# Patient Record
Sex: Female | Born: 1955
Health system: Southern US, Community
[De-identification: ages and names within clinical notes are randomized; demographics above are authoritative.]

## PROBLEM LIST (undated history)

## (undated) DIAGNOSIS — M1990 Unspecified osteoarthritis, unspecified site: Secondary | ICD-10-CM

## (undated) DIAGNOSIS — S0990XA Unspecified injury of head, initial encounter: Secondary | ICD-10-CM

## (undated) DIAGNOSIS — M719 Bursopathy, unspecified: Secondary | ICD-10-CM

## (undated) HISTORY — PX: OTHER SURGICAL HISTORY: SHX169

## (undated) HISTORY — PX: BRAIN SURGERY: SHX531

## (undated) HISTORY — PX: FRACTURE SURGERY: SHX138

## (undated) HISTORY — PX: APPENDECTOMY: SHX54

---

## 1998-09-20 ENCOUNTER — Ambulatory Visit (HOSPITAL_COMMUNITY): Admission: RE | Admit: 1998-09-20 | Discharge: 1998-09-20 | Payer: Self-pay | Admitting: Obstetrics & Gynecology

## 1998-09-20 ENCOUNTER — Encounter: Payer: Self-pay | Admitting: Obstetrics & Gynecology

## 1999-09-25 ENCOUNTER — Encounter: Payer: Self-pay | Admitting: Obstetrics & Gynecology

## 1999-09-25 ENCOUNTER — Ambulatory Visit (HOSPITAL_COMMUNITY): Admission: RE | Admit: 1999-09-25 | Discharge: 1999-09-25 | Payer: Self-pay | Admitting: Obstetrics & Gynecology

## 2000-09-27 ENCOUNTER — Encounter: Payer: Self-pay | Admitting: Obstetrics & Gynecology

## 2000-09-27 ENCOUNTER — Ambulatory Visit (HOSPITAL_COMMUNITY): Admission: RE | Admit: 2000-09-27 | Discharge: 2000-09-27 | Payer: Self-pay | Admitting: Obstetrics & Gynecology

## 2001-01-26 ENCOUNTER — Encounter: Admission: RE | Admit: 2001-01-26 | Discharge: 2001-01-26 | Payer: Self-pay | Admitting: Family Medicine

## 2001-01-26 ENCOUNTER — Encounter: Payer: Self-pay | Admitting: Family Medicine

## 2003-01-12 ENCOUNTER — Other Ambulatory Visit: Admission: RE | Admit: 2003-01-12 | Discharge: 2003-01-12 | Payer: Self-pay | Admitting: Obstetrics & Gynecology

## 2004-06-09 ENCOUNTER — Other Ambulatory Visit: Admission: RE | Admit: 2004-06-09 | Discharge: 2004-06-09 | Payer: Self-pay | Admitting: Obstetrics & Gynecology

## 2005-09-08 ENCOUNTER — Other Ambulatory Visit: Admission: RE | Admit: 2005-09-08 | Discharge: 2005-09-08 | Payer: Self-pay | Admitting: Obstetrics & Gynecology

## 2009-09-26 ENCOUNTER — Encounter (INDEPENDENT_AMBULATORY_CARE_PROVIDER_SITE_OTHER): Payer: Self-pay | Admitting: *Deleted

## 2010-10-02 NOTE — Letter (Signed)
Summary: New Patient letter  [facility]  [address] [place], [place] [address]   Phone: [phone]  Fax: [phone]       09/26/2009 MRN: [id number]  [facility] [address] [place], [place]  [address]  Dear Taylor Munoz,  Welcome to the Gastroenterology Division at [facility]    You are scheduled to see Dr.  Marina Goodell on 10/14/2009 at 11:15AM on the 3rd floor at [facility], [address]  We ask that you try to arrive at our office 15 minutes prior to your appointment time to allow for check-in.  We would like you to complete the enclosed self-administered evaluation form prior to your visit and bring it with you on the day of your appointment.  We will review it with you.  Also, please bring a complete list of all your medications or, if you prefer, bring the medication bottles and we will list them.  Please bring your insurance card so that we may make a copy of it.  If your insurance requires a referral to see a specialist, please bring your referral form from your primary care physician.  Co-payments are due at the time of your visit and may be paid by cash, check or credit card.     Your office visit will consist of a consult with your physician (includes a physical exam), any laboratory testing he/she may order, scheduling of any necessary diagnostic testing (e.g. x-ray, ultrasound, CT-scan), and scheduling of a procedure (e.g. Endoscopy, Colonoscopy) if required.  Please allow enough time on your schedule to allow for any/all of these possibilities.    If you cannot keep your appointment, please call [phone] to cancel or reschedule prior to your appointment date.  This allows [place] the opportunity to schedule an appointment for another patient in need of care.  If you do not cancel or reschedule by 5 p.m. the business day prior to your appointment date, you will be charged a $50.00 late cancellation/no-show fee.    Thank you for  choosing [facility] for your medical needs.  We appreciate the opportunity to care for you.  Please visit [place] at our website  to learn more about our practice.                     Sincerely,                                                             The Gastroenterology Division

## 2012-02-26 ENCOUNTER — Emergency Department (HOSPITAL_COMMUNITY)
Admission: EM | Admit: 2012-02-26 | Discharge: 2012-02-26 | Disposition: A | Discharge status: Home / Self Care | Payer: BC Managed Care – PPO | Attending: Emergency Medicine | Admitting: Emergency Medicine

## 2012-02-26 ENCOUNTER — Encounter (HOSPITAL_COMMUNITY): Payer: Self-pay | Admitting: Family Medicine

## 2012-02-26 ENCOUNTER — Telehealth (HOSPITAL_COMMUNITY): Payer: Self-pay | Admitting: *Deleted

## 2012-02-26 DIAGNOSIS — Z209 Contact with and (suspected) exposure to unspecified communicable disease: Secondary | ICD-10-CM

## 2012-02-26 DIAGNOSIS — Z79899 Other long term (current) drug therapy: Secondary | ICD-10-CM | POA: Insufficient documentation

## 2012-02-26 DIAGNOSIS — F17200 Nicotine dependence, unspecified, uncomplicated: Secondary | ICD-10-CM | POA: Insufficient documentation

## 2012-02-26 DIAGNOSIS — Z203 Contact with and (suspected) exposure to rabies: Secondary | ICD-10-CM | POA: Insufficient documentation

## 2012-02-26 HISTORY — DX: Unspecified osteoarthritis, unspecified site: M19.90

## 2012-02-26 HISTORY — DX: Unspecified injury of head, initial encounter: S09.90XA

## 2012-02-26 HISTORY — DX: Bursopathy, unspecified: M71.9

## 2012-02-26 MED ORDER — RABIES IMMUNE GLOBULIN 150 UNIT/ML IM INJ
20.0000 [IU]/kg | INJECTION | Freq: Once | INTRAMUSCULAR | Status: AC
  Administered 2012-02-26: 1125 [IU] via INTRAMUSCULAR
  Filled 2012-02-26: qty 8

## 2012-02-26 MED ORDER — RABIES VACCINE, PCEC IM SUSR
1.0000 mL | Freq: Once | INTRAMUSCULAR | Status: AC
  Administered 2012-02-26: 1 mL via INTRAMUSCULAR
  Filled 2012-02-26: qty 1

## 2012-02-26 NOTE — ED Provider Notes (Signed)
Medical screening examination/treatment/procedure(s) were performed by non-physician practitioner and as supervising physician I was immediately available for consultation/collaboration.  Doug Sou, MD 02/26/12 [phone]

## 2012-02-26 NOTE — ED Provider Notes (Signed)
History     CSN: [id number]  Arrival date & time 02/26/12  1117   First MD Initiated Contact with Patient 02/26/12 1138      Chief Complaint  Patient presents with  . Rabies Injection    (Consider location/radiation/quality/duration/timing/severity/associated sxs/prior treatment) HPI Comments: Patient comes after possible exposure to bats.  She reports that he woke up on both Saturday morning and 2023-02-07 morning and found a dead bat in the house.  She does not feel that he was bitten by the bat, but was sleeping in the same area as the bat.  Her tetanus is UTD.  She denies any noticeable wound.  She is currently asymptomatic.   The history is provided by the patient.    Past Medical History  Diagnosis Date  . Bursitis   . Arthritis   . Head injury     Past Surgical History  Procedure Date  . Fracture surgery   . Appendectomy   . Brain surgery   . Tracheotomy     History reviewed. No pertinent family history.  History  Substance Use Topics  . Smoking status: Current Some Day Smoker  . Smokeless tobacco: Not on file  . Alcohol Use: 0.6 oz/week    1 Glasses of wine per week    OB History    Grav Para Term Preterm Abortions TAB SAB Ect Mult Living                  Review of Systems  Constitutional: Negative for fever, chills and fatigue.  HENT: Negative for sore throat, neck pain and neck stiffness.   Eyes: Negative for photophobia and visual disturbance.  Respiratory: Negative for shortness of breath.   Gastrointestinal: Negative for nausea and vomiting.  Musculoskeletal: Negative for gait problem.  Skin: Negative for color change and wound.  Neurological: Negative for syncope, light-headedness and headaches.  Psychiatric/Behavioral: Negative for confusion.    Allergies  Review of patient's allergies indicates no known allergies.  Home Medications   Current Outpatient Rx  Name Route Sig Dispense Refill  . CALCIUM CARBONATE-VITAMIN D 500-200 MG-UNIT PO  TABS Oral Take 1 tablet by mouth daily.    . ETODOLAC 400 MG PO TABS Oral Take 400 mg by mouth 2 (two) times daily.    Marland Kitchen MESALAMINE ER 0.375 G PO CP24 Oral Take 1,500 mg by mouth every morning.    . ADULT MULTIVITAMIN W/MINERALS CH Oral Take 1 tablet by mouth daily.    . OMEGA-3-ACID ETHYL ESTERS 1 G PO CAPS Oral Take 1 g by mouth 2 (two) times daily.      BP 103/62  Pulse 62  Temp 98.4 F (36.9 C) (Oral)  Resp 14  Wt 120 lb 5 oz (54.573 kg)  SpO2 98%  Physical Exam  Nursing note and vitals reviewed. Constitutional: She is oriented to person, place, and time. She appears well-developed and well-nourished. No distress.  HENT:  Head: Normocephalic and atraumatic.  Mouth/Throat: Oropharynx is clear and moist.  Eyes: EOM are normal. Pupils are equal, round, and reactive to light.  Neck: Normal range of motion. Neck supple.  Cardiovascular: Normal rate, regular rhythm and normal heart sounds.   Pulmonary/Chest: Effort normal and breath sounds normal. No respiratory distress.  Abdominal: Soft. There is no tenderness.  Musculoskeletal: Normal range of motion.  Neurological: She is alert and oriented to person, place, and time. She has normal strength. No sensory deficit. Gait normal.  Skin: Skin is warm, dry and intact. She  is not diaphoretic. No erythema.       No wound or bite visualized  Psychiatric: She has a normal mood and affect.    ED Course  Procedures (including critical care time)  Labs Reviewed - No data to display No results found.   1. Exposure to bat without known bite       MDM  Patient presents after finding a dead bat in her house.  She is concerned about rabies.  Since the patient has been sleeping in the same area where the bat was found, patient was started on Rabies prophylaxis.  No obvious bite.  Patient is currently asymptomatic.  Patient given the schedule for follow up Rabies vaccines.        Pascal Lux [place], PA-C 02/26/12 1402

## 2012-02-26 NOTE — ED Notes (Signed)
Rabies schedule received. I called pt. and asked when she could come on Mon. She is not at home and does not know her schedule. She will call me on Mon. @ 1130 to set the times. Vassie Moselle 02/26/2012

## 2012-02-26 NOTE — Discharge Instructions (Signed)
Follow up for additional rabies vaccine in 3 days, 7 days, and 14 days.

## 2012-02-26 NOTE — ED Notes (Signed)
Rabies shot schedule completed and faxed to [facility] RN for patient.  Schedule faxed to [facility] and pharmacy x 2.   

## 2012-02-26 NOTE — ED Notes (Signed)
Pt reports finding two bats in her house this morning, both were dead.  States was told to come here for possible rabies exposure.

## 2012-02-29 ENCOUNTER — Encounter (HOSPITAL_COMMUNITY): Payer: Self-pay

## 2012-02-29 ENCOUNTER — Emergency Department (HOSPITAL_COMMUNITY)
Admission: EM | Admit: 2012-02-29 | Discharge: 2012-02-29 | Disposition: A | Discharge status: Home / Self Care | Payer: BC Managed Care – PPO | Source: Home / Self Care

## 2012-02-29 DIAGNOSIS — Z23 Encounter for immunization: Secondary | ICD-10-CM

## 2012-02-29 MED ORDER — RABIES VACCINE, PCEC IM SUSR
INTRAMUSCULAR | Status: AC
  Filled 2012-02-29: qty 1

## 2012-02-29 MED ORDER — RABIES VACCINE, PCEC IM SUSR
1.0000 mL | Freq: Once | INTRAMUSCULAR | Status: AC
  Administered 2012-02-29: 1 mL via INTRAMUSCULAR

## 2012-02-29 NOTE — ED Notes (Signed)
Here for day #3 in series

## 2012-02-29 NOTE — Discharge Instructions (Signed)
Call if any problems.  Return 7/5 for next vaccine.

## 2012-03-04 ENCOUNTER — Encounter (HOSPITAL_COMMUNITY): Payer: Self-pay | Admitting: *Deleted

## 2012-03-04 ENCOUNTER — Emergency Department (INDEPENDENT_AMBULATORY_CARE_PROVIDER_SITE_OTHER)
Admission: EM | Admit: 2012-03-04 | Discharge: 2012-03-04 | Disposition: A | Discharge status: Home / Self Care | Payer: BC Managed Care – PPO | Source: Home / Self Care

## 2012-03-04 DIAGNOSIS — Z23 Encounter for immunization: Secondary | ICD-10-CM

## 2012-03-04 MED ORDER — RABIES VACCINE, PCEC IM SUSR
INTRAMUSCULAR | Status: AC
  Filled 2012-03-04: qty 1

## 2012-03-04 MED ORDER — RABIES VACCINE, PCEC IM SUSR
1.0000 mL | Freq: Once | INTRAMUSCULAR | Status: AC
  Administered 2012-03-04: 1 mL via INTRAMUSCULAR

## 2012-03-04 NOTE — ED Notes (Addendum)
Herer for third rabies vaccine for bat exposure.  No problems from previous injections.

## 2012-03-11 ENCOUNTER — Emergency Department (INDEPENDENT_AMBULATORY_CARE_PROVIDER_SITE_OTHER)
Admission: EM | Admit: 2012-03-11 | Discharge: 2012-03-11 | Disposition: A | Discharge status: Home / Self Care | Payer: BC Managed Care – PPO | Source: Home / Self Care

## 2012-03-11 ENCOUNTER — Encounter (HOSPITAL_COMMUNITY): Payer: Self-pay | Admitting: *Deleted

## 2012-03-11 DIAGNOSIS — Z23 Encounter for immunization: Secondary | ICD-10-CM

## 2012-03-11 MED ORDER — RABIES VACCINE, PCEC IM SUSR
INTRAMUSCULAR | Status: AC
  Filled 2012-03-11: qty 1

## 2012-03-11 MED ORDER — RABIES VACCINE, PCEC IM SUSR: 1.0000 mL | Freq: Once | INTRAMUSCULAR | Status: DC

## 2012-03-11 NOTE — ED Notes (Signed)
Here for last rabies vaccine for bat exposure.  No previous problems from injections. 

## 2014-11-01 ENCOUNTER — Emergency Department (HOSPITAL_COMMUNITY)
Admission: EM | Admit: 2014-11-01 | Discharge: 2014-11-01 | Disposition: A | Discharge status: Home / Self Care | Payer: BLUE CROSS/BLUE SHIELD | Attending: Emergency Medicine | Admitting: Emergency Medicine

## 2014-11-01 ENCOUNTER — Encounter (HOSPITAL_COMMUNITY): Payer: Self-pay | Admitting: *Deleted

## 2014-11-01 ENCOUNTER — Emergency Department (HOSPITAL_COMMUNITY): Payer: BLUE CROSS/BLUE SHIELD

## 2014-11-01 DIAGNOSIS — G44319 Acute post-traumatic headache, not intractable: Secondary | ICD-10-CM

## 2014-11-01 DIAGNOSIS — Z79899 Other long term (current) drug therapy: Secondary | ICD-10-CM | POA: Insufficient documentation

## 2014-11-01 DIAGNOSIS — Y9289 Other specified places as the place of occurrence of the external cause: Secondary | ICD-10-CM | POA: Insufficient documentation

## 2014-11-01 DIAGNOSIS — M1990 Unspecified osteoarthritis, unspecified site: Secondary | ICD-10-CM | POA: Insufficient documentation

## 2014-11-01 DIAGNOSIS — Y9389 Activity, other specified: Secondary | ICD-10-CM | POA: Insufficient documentation

## 2014-11-01 DIAGNOSIS — W01198A Fall on same level from slipping, tripping and stumbling with subsequent striking against other object, initial encounter: Secondary | ICD-10-CM | POA: Diagnosis not present

## 2014-11-01 DIAGNOSIS — Y998 Other external cause status: Secondary | ICD-10-CM | POA: Diagnosis not present

## 2014-11-01 DIAGNOSIS — Z87891 Personal history of nicotine dependence: Secondary | ICD-10-CM | POA: Insufficient documentation

## 2014-11-01 DIAGNOSIS — S0990XA Unspecified injury of head, initial encounter: Secondary | ICD-10-CM

## 2014-11-01 LAB — CBC WITH DIFFERENTIAL/PLATELET
Basophils Absolute: 0 10*3/uL (ref 0.0–0.1)
Basophils Relative: 0 % (ref 0–1)
Eosinophils Absolute: 0.1 10*3/uL (ref 0.0–0.7)
Eosinophils Relative: 2 % (ref 0–5)
HEMATOCRIT: 35.1 % — AB (ref 36.0–46.0)
HEMOGLOBIN: 12.3 g/dL (ref 12.0–15.0)
LYMPHS PCT: 22 % (ref 12–46)
Lymphs Abs: 0.7 10*3/uL (ref 0.7–4.0)
MCH: 30.3 pg (ref 26.0–34.0)
MCHC: 35 g/dL (ref 30.0–36.0)
MCV: 86.5 fL (ref 78.0–100.0)
MONO ABS: 0.3 10*3/uL (ref 0.1–1.0)
MONOS PCT: 8 % (ref 3–12)
NEUTROS ABS: 2.2 10*3/uL (ref 1.7–7.7)
Neutrophils Relative %: 67 % (ref 43–77)
Platelets: 119 10*3/uL — ABNORMAL LOW (ref 150–400)
RBC: 4.06 MIL/uL (ref 3.87–5.11)
RDW: 13.6 % (ref 11.5–15.5)
WBC: 3.2 10*3/uL — AB (ref 4.0–10.5)

## 2014-11-01 LAB — BASIC METABOLIC PANEL
Anion gap: 8 (ref 5–15)
BUN: 10 mg/dL (ref 6–23)
CALCIUM: 8.8 mg/dL (ref 8.4–10.5)
CO2: 27 mmol/L (ref 19–32)
CREATININE: 0.82 mg/dL (ref 0.50–1.10)
Chloride: 98 mmol/L (ref 96–112)
GFR calc Af Amer: 90 mL/min — ABNORMAL LOW (ref >90)
GFR calc non Af Amer: 77 mL/min — ABNORMAL LOW (ref >90)
GLUCOSE: 102 mg/dL — AB (ref 70–99)
Potassium: 4.1 mmol/L (ref 3.5–5.1)
Sodium: 133 mmol/L — ABNORMAL LOW (ref 135–145)

## 2014-11-01 MED ORDER — SODIUM CHLORIDE 0.9 % IV BOLUS (SEPSIS)
1000.0000 mL | Freq: Once | INTRAVENOUS | Status: AC
  Administered 2014-11-01: 1000 mL via INTRAVENOUS

## 2014-11-01 MED ORDER — METOCLOPRAMIDE HCL 5 MG/ML IJ SOLN
10.0000 mg | Freq: Once | INTRAMUSCULAR | Status: AC
  Administered 2014-11-01: 10 mg via INTRAVENOUS
  Filled 2014-11-01: qty 2

## 2014-11-01 NOTE — ED Notes (Signed)
Patient did take tylenol and aleve this morning

## 2014-11-01 NOTE — ED Provider Notes (Signed)
CSN: [id number]     Arrival date & time 11/01/14  1009 History   First MD Initiated Contact with Patient 11/01/14 1023     Chief Complaint  Patient presents with  . Fall  . Headache  . Back Pain     (Consider location/radiation/quality/duration/timing/severity/associated sxs/prior Treatment) HPI  Taylor Munoz is a 59 y.o. female with PMH of arthritis presenting with fall 3 days ago. Patient fell backwards hitting her head. Patient stated she tripped. She denies any loss of consciousness. Patient gradually developed headaches that are generalized and dull achy. She also reports ringing in her ears. She was sore all over with some nausea but no emesis. She denies any visual changes, slurred speech, weakness. She does report tinnitus but no lightheadedness or vertigo. She has not had any syncope. She denies any fevers chills, cough congestion.   Past Medical History  Diagnosis Date  . Bursitis   . Arthritis   . Head injury    Past Surgical History  Procedure Laterality Date  . Fracture surgery    . Appendectomy    . Brain surgery    . Tracheotomy    . Cesarean section     No family history on file. History  Substance Use Topics  . Smoking status: Former Smoker    Types: Cigarettes    Quit date: 10/25/2014  . Smokeless tobacco: Not on file  . Alcohol Use: 0.6 oz/week    1 Glasses of wine per week     Comment: occasional   OB History    No data available     Review of Systems 10 Systems reviewed and are negative for acute change except as noted in the HPI.    Allergies  Review of patient's allergies indicates no known allergies.  Home Medications   Prior to Admission medications   Medication Sig Start Date End Date Taking? Authorizing Provider  acetaminophen (TYLENOL) 325 MG tablet Take 325 mg by mouth daily.   Yes Historical Provider, MD  calcium-vitamin D (OSCAL WITH D) 500-200 MG-UNIT per tablet Take 1 tablet by mouth daily.   Yes Historical Provider, MD   glucosamine-chondroitin 500-400 MG tablet Take 1 tablet by mouth 3 (three) times daily.   Yes Historical Provider, MD  ibuprofen (ADVIL,MOTRIN) 200 MG tablet Take 200 mg by mouth daily. Pt states she takes one ibuprofen and one tylenol every morning for knee pain, [facility]   Yes Historical Provider, MD  Multiple Vitamin (MULTIVITAMIN WITH MINERALS) TABS Take 1 tablet by mouth daily.   Yes Historical Provider, MD  omega-3 acid ethyl esters (LOVAZA) 1 G capsule Take 1 g by mouth 2 (two) times daily.   Yes Historical Provider, MD  OVER THE COUNTER MEDICATION Apply 1 application topically 2 (two) times daily. 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   Yes Historical Provider, MD   BP 91/53 mmHg  Pulse 60  Temp(Src) 97.7 F (36.5 C) (Oral)  Resp 20  Ht 5\' 5"  (1.651 m)  Wt 105 lb (47.628 kg)  BMI 17.47 kg/m2  SpO2 98% Physical Exam  Constitutional: She appears well-developed and well-nourished. No distress.  HENT:  Head: Normocephalic and atraumatic.  Mouth/Throat: Oropharynx is clear and moist.  Eyes: Conjunctivae and EOM are normal. Pupils are equal, round, and reactive to light. Right eye exhibits no discharge. Left eye exhibits no discharge.  Neck: Normal range of  motion. Neck supple.  No nuchal rigidity  Cardiovascular: Normal rate and regular rhythm.   Pulmonary/Chest: Effort normal and breath sounds normal. No respiratory distress. She has no wheezes.  Abdominal: Soft. Bowel sounds are normal. She exhibits no distension. There is no tenderness.  Neurological: She is alert. No cranial nerve deficit. Coordination normal.  Speech is clear and goal oriented. Peripheral visual fields intact. Strength 5/5 in upper and lower extremities. Sensation intact. Intact rapid alternating movements, finger to nose, and heel to shin. Negative Romberg. No pronator drift. Normal gait.   Skin: Skin is warm and dry. She  is not diaphoretic.  Nursing note and vitals reviewed.   ED Course  Procedures (including critical care time) Labs Review Labs Reviewed  CBC WITH DIFFERENTIAL/PLATELET - Abnormal; Notable for the following:    WBC 3.2 (*)    HCT 35.1 (*)    Platelets 119 (*)    All other components within normal limits  BASIC METABOLIC PANEL - Abnormal; Notable for the following:    Sodium 133 (*)    Glucose, Bld 102 (*)    GFR calc non Af Amer 77 (*)    GFR calc Af Amer 90 (*)    All other components within normal limits    Imaging Review Ct Head Wo Contrast  11/01/2014   CLINICAL DATA:  Fall down steps in 10/28/14, headache, back pain  EXAM: CT HEAD WITHOUT CONTRAST  TECHNIQUE: Contiguous axial images were obtained from the base of the skull through the vertex without intravenous contrast.  COMPARISON:  None.  FINDINGS: No skull fracture is noted. Paranasal sinuses and mastoid air cells are unremarkable.  No intracranial hemorrhage, mass effect or midline shift.  No acute infarction. No mass lesion is noted on this unenhanced scan. No hydrocephalus. The gray and white-matter differentiation is preserved. No intra or extra-axial fluid collection.  IMPRESSION: No acute intracranial abnormality.   Electronically Signed   By: Natasha Mead M.D.   On: 11/01/2014 12:06     EKG Interpretation None      MDM   Final diagnoses:  Acute post-traumatic headache, not intractable  Head injury, initial encounter   Pt HA treated and resolved while in ED.  Presentation is  gradual in onset, not maximal in onset, and not worse of life. No visual or speech changes, no vomiting, and no weakness. Pt is afebrile with no focal neuro deficits or nuchal rigidity. CT without any evidence of acute hemorrhage or other intracranial abnormalities. I doubt SAH, ICH, meningits. Pt is to follow up with PCP. Pt verbalizes understanding and is agreeable with plan to dc.   Discussed return precautions with patient. Discussed all  results and patient verbalizes understanding and agrees with plan.  Case has been discussed with  Dr. Estell Harpin who agrees with the above plan and to discharge.    Louann Sjogren, PA-C 11/01/14 1303  Benny Lennert, MD 11/02/14 [id number]

## 2014-11-01 NOTE — Discharge Instructions (Signed)
Return to the emergency room with worsening of symptoms, new symptoms or with symptoms that are concerning , especially severe worsening of headache, visual or speech changes, weakness in face, arms or legs. RICE: Rest, Ice (three cycles of 20 mins on, off at least twice a day), compression/brace, elevation. Heating pad works well for back pain. Ibuprofen  (2 tablets ) every 5-6 hours for 3-5 days. Please call your doctor for a followup appointment within 24-48 hours. When you talk to your doctor please let them know that you were seen in the emergency department and have them acquire all of your records so that they can discuss the findings with you and formulate a treatment plan to fully care for your new and ongoing problems. If you do not have a primary care provider please call the number below under ED resources to establish care with a provider and follow up.  Read below information and follow recommendations.  Concussion A concussion, or closed-head injury, is a brain injury caused by a direct blow to the head or by a quick and sudden movement (jolt) of the head or neck. Concussions are usually not life-threatening. Even so, the effects of a concussion can be serious. If you have had a concussion before, you are more likely to experience concussion-like symptoms after a direct blow to the head.  CAUSES  Direct blow to the head, such as from running into another player during a soccer game, being hit in a fight, or hitting your head on a hard surface.  A jolt of the head or neck that causes the brain to move back and forth inside the skull, such as in a car crash. SIGNS AND SYMPTOMS The signs of a concussion can be hard to notice. Early on, they may be missed by you, family members, and health care providers. You may look fine but act or feel differently. Symptoms are usually temporary, but they may last for days, weeks, or even longer. Some symptoms may appear right away while  others may not show up for hours or days. Every head injury is different. Symptoms include:  Mild to moderate headaches that will not go away.  A feeling of pressure inside your head.  Having more trouble than usual:  Learning or remembering things you have heard.  Answering questions.  Paying attention or concentrating.  Organizing daily tasks.  Making decisions and solving problems.  Slowness in thinking, acting or reacting, speaking, or reading.  Getting lost or being easily confused.  Feeling tired all the time or lacking energy (fatigued).  Feeling drowsy.  Sleep disturbances.  Sleeping more than usual.  Sleeping less than usual.  Trouble falling asleep.  Trouble sleeping (insomnia).  Loss of balance or feeling lightheaded or dizzy.  Nausea or vomiting.  Numbness or tingling.  Increased sensitivity to:  Sounds.  Lights.  Distractions.  Vision problems or eyes that tire easily.  Diminished sense of taste or smell.  Ringing in the ears.  Mood changes such as feeling sad or anxious.  Becoming easily irritated or angry for little or no reason.  Lack of motivation.  Seeing or hearing things other people do not see or hear (hallucinations). DIAGNOSIS Your health care provider can usually diagnose a concussion based on a description of your injury and symptoms. He or she will ask whether you passed out (lost consciousness) and whether you are having trouble remembering events that happened right before and during your injury. Your evaluation might include:  A  brain scan to look for signs of injury to the brain. Even if the test shows no injury, you may still have a concussion.  Blood tests to be sure other problems are not present. TREATMENT  Concussions are usually treated in an emergency department, in urgent care, or at a clinic. You may need to stay in the hospital overnight for further treatment.  Tell your health care provider if you are  taking any medicines, including prescription medicines, over-the-counter medicines, and natural remedies. Some medicines, such as blood thinners (anticoagulants) and aspirin, may increase the chance of complications. Also tell your health care provider whether you have had alcohol or are taking illegal drugs. This information may affect treatment.  Your health care provider will send you home with important instructions to follow.  How fast you will recover from a concussion depends on many factors. These factors include how severe your concussion is, what part of your brain was injured, your age, and how healthy you were before the concussion.  Most people with mild injuries recover fully. Recovery can take time. In general, recovery is slower in older persons. Also, persons who have had a concussion in the past or have other medical problems may find that it takes longer to recover from their current injury. HOME CARE INSTRUCTIONS General Instructions  Carefully follow the directions your health care provider gave you.  Only take over-the-counter or prescription medicines for pain, discomfort, or fever as directed by your health care provider.  Take only those medicines that your health care provider has approved.  Do not drink alcohol until your health care provider says you are well enough to do so. Alcohol and certain other drugs may slow your recovery and can put you at risk of further injury.  If it is harder than usual to remember things, write them down.  If you are easily distracted, try to do one thing at a time. For example, do not try to watch TV while fixing dinner.  Talk with family members or close friends when making important decisions.  Keep all follow-up appointments. Repeated evaluation of your symptoms is recommended for your recovery.  Watch your symptoms and tell others to do the same. Complications sometimes occur after a concussion. Older adults with a brain injury  may have a higher risk of serious complications, such as a blood clot on the brain.  Tell your teachers, school nurse, school counselor, coach, athletic trainer, or work Production designer, theatre/television/filmmanager about your injury, symptoms, and restrictions. Tell them about what you can or cannot do. They should watch for:  Increased problems with attention or concentration.  Increased difficulty remembering or learning new information.  Increased time needed to complete tasks or assignments.  Increased irritability or decreased ability to cope with stress.  Increased symptoms.  Rest. Rest helps the brain to heal. Make sure you:  Get plenty of sleep at night. Avoid staying up late at night.  Keep the same bedtime hours on weekends and weekdays.  Rest during the day. Take daytime naps or rest breaks when you feel tired.  Limit activities that require a lot of thought or concentration. These include:  Doing homework or job-related work.  Watching TV.  Working on the computer.  Avoid any situation where there is potential for another head injury (football, hockey, soccer, basketball, martial arts, downhill snow sports and horseback riding). Your condition will get worse every time you experience a concussion. You should avoid these activities until you are evaluated by the  appropriate follow-up health care providers. Returning To Your Regular Activities You will need to return to your normal activities slowly, not all at once. You must give your body and brain enough time for recovery.  Do not return to sports or other athletic activities until your health care provider tells you it is safe to do so.  Ask your health care provider when you can drive, ride a bicycle, or operate heavy machinery. Your ability to react may be slower after a brain injury. Never do these activities if you are dizzy.  Ask your health care provider about when you can return to work or school. Preventing Another Concussion It is very  important to avoid another brain injury, especially before you have recovered. In rare cases, another injury can lead to permanent brain damage, brain swelling, or death. The risk of this is greatest during the first 7-10 days after a head injury. Avoid injuries by:  Wearing a seat belt when riding in a car.  Drinking alcohol only in moderation.  Wearing a helmet when biking, skiing, skateboarding, skating, or doing similar activities.  Avoiding activities that could lead to a second concussion, such as contact or recreational sports, until your health care provider says it is okay.  Taking safety measures in your home.  Remove clutter and tripping hazards from floors and stairways.  Use grab bars in bathrooms and handrails by stairs.  Place non-slip mats on floors and in bathtubs.  Improve lighting in dim areas. SEEK MEDICAL CARE IF:  You have increased problems paying attention or concentrating.  You have increased difficulty remembering or learning new information.  You need more time to complete tasks or assignments than before.  You have increased irritability or decreased ability to cope with stress.  You have more symptoms than before. Seek medical care if you have any of the following symptoms for more than 2 weeks after your injury:  Lasting (chronic) headaches.  Dizziness or balance problems.  Nausea.  Vision problems.  Increased sensitivity to noise or light.  Depression or mood swings.  Anxiety or irritability.  Memory problems.  Difficulty concentrating or paying attention.  Sleep problems.  Feeling tired all the time. SEEK IMMEDIATE MEDICAL CARE IF:  You have severe or worsening headaches. These may be a sign of a blood clot in the brain.  You have weakness (even if only in one hand, leg, or part of the face).  You have numbness.  You have decreased coordination.  You vomit repeatedly.  You have increased sleepiness.  One pupil is  larger than the other.  You have convulsions.  You have slurred speech.  You have increased confusion. This may be a sign of a blood clot in the brain.  You have increased restlessness, agitation, or irritability.  You are unable to recognize people or places.  You have neck pain.  It is difficult to wake you up.  You have unusual behavior changes.  You lose consciousness. MAKE SURE YOU:  Understand these instructions.  Will watch your condition.  Will get help right away if you are not doing well or get worse. Document Released: 11/07/2003 Document Revised: 08/22/2013 Document Reviewed: 03/09/2013 [facility] Patient Information 2015 [place], [place]. This information is not intended to replace advice given to you by your health care provider. Make sure you discuss any questions you have with your health care provider.

## 2014-11-01 NOTE — ED Notes (Signed)
Patient fell down a flight of steps onto a wooden floor.  She did not seek medical attention at that time.  She is having headaches and ringing in her ears.  She states she was sore all over.  She has a contusion on her back.  She admits to feeling nauseated.  She ambulated in from triage.

## 2017-02-24 ENCOUNTER — Ambulatory Visit (INDEPENDENT_AMBULATORY_CARE_PROVIDER_SITE_OTHER): Payer: Self-pay | Admitting: Physician Assistant

## 2017-02-24 ENCOUNTER — Encounter: Payer: Self-pay | Admitting: Physician Assistant

## 2017-02-24 ENCOUNTER — Ambulatory Visit (INDEPENDENT_AMBULATORY_CARE_PROVIDER_SITE_OTHER): Payer: Self-pay

## 2017-02-24 VITALS — BP 98/62 | HR 60 | Temp 98.6°F | Resp 16 | Ht 65.0 in | Wt 113.6 lb

## 2017-02-24 DIAGNOSIS — R059 Cough, unspecified: Secondary | ICD-10-CM

## 2017-02-24 DIAGNOSIS — R05 Cough: Secondary | ICD-10-CM

## 2017-02-24 DIAGNOSIS — R938 Abnormal findings on diagnostic imaging of other specified body structures: Secondary | ICD-10-CM

## 2017-02-24 DIAGNOSIS — F17200 Nicotine dependence, unspecified, uncomplicated: Secondary | ICD-10-CM

## 2017-02-24 DIAGNOSIS — J189 Pneumonia, unspecified organism: Secondary | ICD-10-CM

## 2017-02-24 DIAGNOSIS — D72829 Elevated white blood cell count, unspecified: Secondary | ICD-10-CM

## 2017-02-24 DIAGNOSIS — R9389 Abnormal findings on diagnostic imaging of other specified body structures: Secondary | ICD-10-CM

## 2017-02-24 DIAGNOSIS — R5383 Other fatigue: Secondary | ICD-10-CM

## 2017-02-24 LAB — POCT CBC
Granulocyte percent: 74.8 %G (ref 37–80)
HCT, POC: 38.4 % (ref 37.7–47.9)
Hemoglobin: 13 g/dL (ref 12.2–16.2)
Lymph, poc: 2.6 (ref 0.6–3.4)
MCH, POC: 29.9 pg (ref 27–31.2)
MCHC: 33.8 g/dL (ref 31.8–35.4)
MCV: 88.5 fL (ref 80–97)
MID (cbc): 1 — AB (ref 0–0.9)
MPV: 8.7 fL (ref 0–99.8)
POC Granulocyte: 10.5 — AB (ref 2–6.9)
POC LYMPH PERCENT: 18.2 % (ref 10–50)
POC MID %: 7 % (ref 0–12)
Platelet Count, POC: 326 10*3/uL (ref 142–424)
RBC: 4.34 M/uL (ref 4.04–5.48)
RDW, POC: 14.1 %
WBC: 14.1 10*3/uL — AB (ref 4.6–10.2)

## 2017-02-24 MED ORDER — AZITHROMYCIN 250 MG PO TABS: ORAL_TABLET | ORAL | 0 refills | Status: DC

## 2017-02-24 MED ORDER — AZITHROMYCIN 250 MG PO TABS: ORAL_TABLET | ORAL | 0 refills | Status: AC

## 2017-02-24 NOTE — Patient Instructions (Addendum)
Your x-ray showed: Nodular airspace opacity. While this may simply represent superimposition of overlying structures, an underlying pulmonary nodule/mass is difficult to exclude entirely. Recommend further (non emergent) evaluation with CT scan of the chest. I feel it is important to get further imaging. Please consider getting a CT scan. Come back or call and I will put in the referral for imaging.  Please work hard on cutting back on your smoking. See below.   I am treating you today for pneumonia. Please stay well hydrated. Take the entire course of your antibiotic.   Thank you for coming in today. I hope you feel we met your needs.  Feel free to call [facility] if you have any questions or further requests.  Please consider signing up for MyChart if you do not already have it, as this is a great way to communicate with me.  Best,  Whitney McVey, PA-C   Steps to Quit Smoking Smoking tobacco can be harmful to your health and can affect almost every organ in your body. Smoking puts you, and those around you, at risk for developing many serious chronic diseases. Quitting smoking is difficult, but it is one of the best things that you can do for your health. It is never too late to quit. What are the benefits of quitting smoking? When you quit smoking, you lower your risk of developing serious diseases and conditions, such as:  Lung cancer or lung disease, such as COPD.  Heart disease.  Stroke.  Heart attack.  Infertility.  Osteoporosis and bone fractures.  Additionally, symptoms such as coughing, wheezing, and shortness of breath may get better when you quit. You may also find that you get sick less often because your body is stronger at fighting off colds and infections. If you are pregnant, quitting smoking can help to reduce your chances of having a baby of low birth weight. How do I get ready to quit? When you decide to quit smoking, create a plan to make sure that you are successful.  Before you quit:  Pick a date to quit. Set a date within the next two weeks to give you time to prepare.  Write down the reasons why you are quitting. Keep this list in places where you will see it often, such as on your bathroom mirror or in your car or wallet.  Identify the people, places, things, and activities that make you want to smoke (triggers) and avoid them. Make sure to take these actions: ? Throw away all cigarettes at home, at work, and in your car. ? Throw away smoking accessories, such as Scientist, research (medical). ? Clean your car and make sure to empty the ashtray. ? Clean your home, including curtains and carpets.  Tell your family, friends, and coworkers that you are quitting. Support from your loved ones can make quitting easier.  Talk with your health care provider about your options for quitting smoking.  Find out what treatment options are covered by your health insurance.  What strategies can I use to quit smoking? Talk with your healthcare provider about different strategies to quit smoking. Some strategies include:  Quitting smoking altogether instead of gradually lessening how much you smoke over a period of time. Research shows that quitting "cold [place]" is more successful than gradually quitting.  Attending in-person counseling to help you build problem-solving skills. You are more likely to have success in quitting if you attend several counseling sessions. Even short sessions of 10 minutes can be effective.  Finding resources and support systems that can help you to quit smoking and remain smoke-free after you quit. These resources are most helpful when you use them often. They can include: ? Online chats with a Social worker. ? Telephone quitlines. ? Careers information officer. ? Support groups or group counseling. ? Text messaging programs. ? Mobile phone applications.  Taking medicines to help you quit smoking. (If you are pregnant or breastfeeding, talk  with your health care provider first.) Some medicines contain nicotine and some do not. Both types of medicines help with cravings, but the medicines that include nicotine help to relieve withdrawal symptoms. Your health care provider may recommend: ? Nicotine patches, gum, or lozenges. ? Nicotine inhalers or sprays. ? Non-nicotine medicine that is taken by mouth.  Talk with your health care provider about combining strategies, such as taking medicines while you are also receiving in-person counseling. Using these two strategies together makes you more likely to succeed in quitting than if you used either strategy on its own. If you are pregnant or breastfeeding, talk with your health care provider about finding counseling or other support strategies to quit smoking. Do not take medicine to help you quit smoking unless told to do so by your health care provider. What things can I do to make it easier to quit? Quitting smoking might feel overwhelming at first, but there is a lot that you can do to make it easier. Take these important actions:  Reach out to your family and friends and ask that they support and encourage you during this time. Call telephone quitlines, reach out to support groups, or work with a counselor for support.  Ask people who smoke to avoid smoking around you.  Avoid places that trigger you to smoke, such as bars, parties, or smoke-break areas at work.  Spend time around people who do not smoke.  Lessen stress in your life, because stress can be a smoking trigger for some people. To lessen stress, try: ? Exercising regularly. ? Deep-breathing exercises. ? Yoga. ? Meditating. ? Performing a body scan. This involves closing your eyes, scanning your body from head to toe, and noticing which parts of your body are particularly tense. Purposefully relax the muscles in those areas.  Download or purchase mobile phone or tablet apps (applications) that can help you stick to your  quit plan by providing reminders, tips, and encouragement. There are many free apps, such as QuitGuide from the [employer] Office manager for Disease Control and Prevention). You can find other support for quitting smoking (smoking cessation) through smokefree.gov and other websites.  How will I feel when I quit smoking? Within the first 24 hours of quitting smoking, you may start to feel some withdrawal symptoms. These symptoms are usually most noticeable 2-3 days after quitting, but they usually do not last beyond 2-3 weeks. Changes or symptoms that you might experience include:  Mood swings.  Restlessness, anxiety, or irritation.  Difficulty concentrating.  Dizziness.  Strong cravings for sugary foods in addition to nicotine.  Mild weight gain.  Constipation.  Nausea.  Coughing or a sore throat.  Changes in how your medicines work in your body.  A depressed mood.  Difficulty sleeping (insomnia).  After the first 2-3 weeks of quitting, you may start to notice more positive results, such as:  Improved sense of smell and taste.  Decreased coughing and sore throat.  Slower heart rate.  Lower blood pressure.  Clearer skin.  The ability to breathe more easily.  Fewer  sick days.  Quitting smoking is very challenging for most people. Do not get discouraged if you are not successful the first time. Some people need to make many attempts to quit before they achieve long-term success. Do your best to stick to your quit plan, and talk with your health care provider if you have any questions or concerns. This information is not intended to replace advice given to you by your health care provider. Make sure you discuss any questions you have with your health care provider. Document Released: 08/11/2001 Document Revised: 04/14/2016 Document Reviewed: 01/01/2015 Elsevier Interactive Patient Education  2017 [employer].  IF you received an x-ray today, you will receive an invoice from  [facility]. Please contact [facility] at [phone] with questions or concerns regarding your invoice.   IF you received labwork today, you will receive an invoice from [facility]. Please contact [facility] at [phone] with questions or concerns regarding your invoice.   Our billing staff will not be able to assist you with questions regarding bills from these companies.  You will be contacted with the lab results as soon as they are available. The fastest way to get your results is to activate your My Chart account. Instructions are located on the last page of this paperwork. If you have not heard from [place] regarding the results in 2 weeks, please contact this office.

## 2017-02-24 NOTE — Progress Notes (Signed)
Taylor Munoz  MRN: [id number] DOB: December 18, 1955  PCP: Freddy Finner, MD  Subjective:  Pt is a pleasant 61 year old female who presents to clinic for fatigue and cough. Cough x 5 weeks, is productive with occasional green sputum. She had a fever 5 weeks ago. Endorses some chills/sweats occasionally.   Delsym at night - helping sleep.  Denies night sweats, weight loss, n/v, shob, wheezing, palpitations, chest pain.   H/o smoking - She started in college and has smoked on and off since then. She has quit several times. Smokes "1-2 or 12-14/day. Depends on the day" "sporatic" smoking.   Review of Systems  Constitutional: Positive for chills, fatigue and fever. Negative for appetite change and diaphoresis.  Respiratory: Positive for cough. Negative for chest tightness, shortness of breath and wheezing.   Cardiovascular: Negative for chest pain and palpitations.  Gastrointestinal: Negative for abdominal pain, constipation, nausea and vomiting.  Skin: Negative.   Psychiatric/Behavioral: Positive for sleep disturbance.    There are no active problems to display for this patient.   Current Outpatient Prescriptions on File Prior to Visit  Medication Sig Dispense Refill  . acetaminophen (TYLENOL) 325 MG tablet Take 325 mg by mouth daily.    . calcium-vitamin D (OSCAL WITH D) 500-200 MG-UNIT per tablet Take 1 tablet by mouth daily.    Marland Kitchen ibuprofen (ADVIL,MOTRIN) 200 MG tablet Take 200 mg by mouth daily. Pt states she takes one ibuprofen and one tylenol every morning for knee pain, directed by RPh at [facility]    . Multiple Vitamin (MULTIVITAMIN WITH MINERALS) TABS Take 1 tablet by mouth daily.    Marland Kitchen glucosamine-chondroitin 500-400 MG tablet Take 1 tablet by mouth 3 (three) times daily.    Marland Kitchen omega-3 acid ethyl esters (LOVAZA) 1 G capsule Take 1 g by mouth 2 (two) times daily.    Marland Kitchen OVER THE COUNTER MEDICATION Apply 1 application topically 2 (two) times daily. 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     No current facility-administered medications on file prior to visit.     No Known Allergies   Objective:  BP 98/62   Pulse 60   Temp 98.6 F (37 C) (Oral)   Resp 16   Ht 5\' 5"  (1.651 m)   Wt 113 lb 9.6 oz (51.5 kg)   SpO2 100%   BMI 18.90 kg/m   Physical Exam  Constitutional: She is oriented to person, place, and time. She appears unhealthy. No distress.  Appears cachectic  Cardiovascular: Normal rate, regular rhythm and normal heart sounds.   Pulmonary/Chest: Effort normal and breath sounds normal. She has no wheezes. She has no rhonchi. She has no rales.  Neurological: She is alert and oriented to person, place, and time. GCS score is 15.  Skin: Skin is warm and dry.  Psychiatric: Mood, memory, affect and judgment normal.  Vitals reviewed.   Dg Chest 2 View  Result Date: 02/24/2017 CLINICAL DATA:  61 year old female with a 5 week history of cough and fatigue. EXAM: CHEST  2 VIEW COMPARISON:  None. FINDINGS: Cardiac and mediastinal contours are within normal limits. The lungs are profoundly hyperinflated and hyperlucent consistent with underlying COPD and emphysema. Additionally, there are areas of patchy linear airspace opacity in the lingula likely reflecting atelectasis/ scarring and possibly bronchiectasis. A patchy nodular opacity overlying the mediastinum at the level of the transverse aorta on the lateral  view is not evident on the frontal view. No pulmonary edema, pleural effusion or pneumothorax. Osseous structures are intact and unremarkable. IMPRESSION: 1. Nodular airspace opacity overlying the mediastinum at the level of the transverse aorta on the lateral view only. While this may simply represent superimposition of overlying structures, an underlying pulmonary nodule/mass is difficult to exclude entirely. Recommend further (non emergent) evaluation with CT scan of the chest. 2. Marked pulmonary  hyperinflation, bronchitic changes and interstitial prominence suggests underlying COPD. 3. Patchy and linear airspace opacities in the lingula may represent atelectasis/scarring/bronchiectasis. Electronically Signed   By: Malachy MoanHeath  McCullough M.D.   On: 02/24/2017 14:19   Results for orders placed or performed in visit on 02/24/17  POCT CBC  Result Value Ref Range   WBC 14.1 (A) 4.6 - 10.2 K/uL   Lymph, poc 2.6 0.6 - 3.4   POC LYMPH PERCENT 18.2 10 - 50 %L   MID (cbc) 1.0 (A) 0 - 0.9   POC MID % 7.0 0 - 12 %M   POC Granulocyte 10.5 (A) 2 - 6.9   Granulocyte percent 74.8 37 - 80 %G   RBC 4.34 4.04 - 5.48 M/uL   Hemoglobin 13.0 12.2 - 16.2 g/dL   HCT, POC 16.138.4 09.637.7 - 47.9 %   MCV 88.5 80 - 97 fL   MCH, POC 29.9 27 - 31.2 pg   MCHC 33.8 31.8 - 35.4 g/dL   RDW, POC 04.514.1 %   Platelet Count, POC 326 142 - 424 K/uL   MPV 8.7 0 - 99.8 fL    Assessment and Plan :  1. Pneumonia due to infectious organism, unspecified laterality, unspecified part of lung 2. Cough 3. Leukocytosis, unspecified type 4. Fatigue, unspecified type - azithromycin (ZITHROMAX) 250 MG tablet; Take 2 tabs PO x 1 dose, then 1 tab PO QD x 4 days  Dispense: 6 tablet; Refill: 0 - DG Chest 2 View; Future - POCT CBC - Elevated white blood cell count with abnormal chest x-ray. Will treat for pneumonia.  5. Abnormal chest x-ray 6. Smoker - Abnormal chest x-ray. Discussed at length with pt need for further imaging. She declines at this time due to not having insurance. She understands the need to follow-up on this matter and will come back when she is ready. Encouraged cut back/quit smoking.   Marco CollieWhitney Calem Cocozza, PA-C  Primary [facility] at [facility] 02/24/2017 1:52 PM

## 2018-07-04 ENCOUNTER — Ambulatory Visit (INDEPENDENT_AMBULATORY_CARE_PROVIDER_SITE_OTHER): Payer: Self-pay | Admitting: Family Medicine

## 2018-07-04 ENCOUNTER — Other Ambulatory Visit: Payer: Self-pay

## 2018-07-04 ENCOUNTER — Encounter: Payer: Self-pay | Admitting: Family Medicine

## 2018-07-04 NOTE — Patient Instructions (Signed)
° ° ° °  If you have lab work done today you will be contacted with your lab results within the next 2 weeks.  If you have not heard from us then please contact us. The fastest way to get your results is to register for My Chart. ° ° °IF you received an x-ray today, you will receive an invoice from [facility]. Please contact [facility] at [phone] with questions or concerns regarding your invoice.  ° °IF you received labwork today, you will receive an invoice from [facility]. Please contact [facility] at [phone] with questions or concerns regarding your invoice.  ° °Our billing staff will not be able to assist you with questions regarding bills from these companies. ° °You will be contacted with the lab results as soon as they are available. The fastest way to get your results is to activate your My Chart account. Instructions are located on the last page of this paperwork. If you have not heard from us regarding the results in 2 weeks, please contact this office. °  ° ° ° °

## 2018-07-04 NOTE — Progress Notes (Signed)
Patient fell and is needing xray She is self pay  Physical exam still performed to assess stability Normal fundoscopic exam Edema of left hand Concerning for thumb fracture but pt refused to go to [facility] She plans to return tomorrow for xray She denies being hit, pushed or punched

## 2018-07-05 ENCOUNTER — Ambulatory Visit (INDEPENDENT_AMBULATORY_CARE_PROVIDER_SITE_OTHER): Payer: BLUE CROSS/BLUE SHIELD | Admitting: Physician Assistant

## 2018-07-05 ENCOUNTER — Ambulatory Visit (INDEPENDENT_AMBULATORY_CARE_PROVIDER_SITE_OTHER): Payer: BLUE CROSS/BLUE SHIELD

## 2018-07-05 ENCOUNTER — Other Ambulatory Visit: Payer: Self-pay

## 2018-07-05 ENCOUNTER — Ambulatory Visit (HOSPITAL_COMMUNITY)
Admission: RE | Admit: 2018-07-05 | Discharge: 2018-07-05 | Disposition: A | Discharge status: Home / Self Care | Payer: BLUE CROSS/BLUE SHIELD | Source: Ambulatory Visit | Attending: Physician Assistant | Admitting: Physician Assistant

## 2018-07-05 ENCOUNTER — Encounter: Payer: Self-pay | Admitting: Physician Assistant

## 2018-07-05 VITALS — BP 102/69 | HR 72 | Temp 97.9°F | Resp 18 | Ht 64.96 in | Wt 115.6 lb

## 2018-07-05 DIAGNOSIS — R51 Headache: Secondary | ICD-10-CM | POA: Diagnosis not present

## 2018-07-05 DIAGNOSIS — M79642 Pain in left hand: Secondary | ICD-10-CM | POA: Diagnosis not present

## 2018-07-05 DIAGNOSIS — M79632 Pain in left forearm: Secondary | ICD-10-CM | POA: Diagnosis not present

## 2018-07-05 DIAGNOSIS — S6992XA Unspecified injury of left wrist, hand and finger(s), initial encounter: Secondary | ICD-10-CM | POA: Diagnosis not present

## 2018-07-05 DIAGNOSIS — G44309 Post-traumatic headache, unspecified, not intractable: Secondary | ICD-10-CM

## 2018-07-05 DIAGNOSIS — M79644 Pain in right finger(s): Secondary | ICD-10-CM

## 2018-07-05 DIAGNOSIS — M25561 Pain in right knee: Secondary | ICD-10-CM

## 2018-07-05 DIAGNOSIS — M25532 Pain in left wrist: Secondary | ICD-10-CM

## 2018-07-05 DIAGNOSIS — S59912A Unspecified injury of left forearm, initial encounter: Secondary | ICD-10-CM | POA: Diagnosis not present

## 2018-07-05 DIAGNOSIS — W19XXXA Unspecified fall, initial encounter: Secondary | ICD-10-CM

## 2018-07-05 DIAGNOSIS — R519 Headache, unspecified: Secondary | ICD-10-CM

## 2018-07-05 DIAGNOSIS — M25511 Pain in right shoulder: Secondary | ICD-10-CM | POA: Diagnosis not present

## 2018-07-05 DIAGNOSIS — S6991XA Unspecified injury of right wrist, hand and finger(s), initial encounter: Secondary | ICD-10-CM | POA: Diagnosis not present

## 2018-07-05 DIAGNOSIS — S62634A Displaced fracture of distal phalanx of right ring finger, initial encounter for closed fracture: Secondary | ICD-10-CM | POA: Diagnosis not present

## 2018-07-05 DIAGNOSIS — S0993XA Unspecified injury of face, initial encounter: Secondary | ICD-10-CM | POA: Diagnosis not present

## 2018-07-05 DIAGNOSIS — S0990XA Unspecified injury of head, initial encounter: Secondary | ICD-10-CM | POA: Diagnosis not present

## 2018-07-05 DIAGNOSIS — S8991XA Unspecified injury of right lower leg, initial encounter: Secondary | ICD-10-CM | POA: Diagnosis not present

## 2018-07-05 NOTE — Patient Instructions (Addendum)
Go to [facility]  ---Radiology Department. Appointment is at 4:00 pm please show up 15 minutes before exam.   For finger fracture, keep splint in place until evaluated by orthopedics. Uses ice to affected area and elevate at home. Use ibuprofen for pain. Follow up with orthopedics.   For wrist pain, use splint and ice accordingly, if still having pain in 2 weeks, please return   If you have lab work done today you will be contacted with your lab results within the next 2 weeks.  If you have not heard from [place] then please contact us. The fastest way to get your results is to register for My Chart.  Finger Fracture A finger fracture is a break in any of the bones in your fingers. Usually, a broken finger is caused by an injury. This includes:  Getting hurt while playing sports.  Getting hurt at work.  Falling.  Your doctor may put a splint on your finger so it will not move while it heals (immobilization). Follow these instructions at home: If you have a splint  Wear the splint as told by your doctor. Take it off only as told by your doctor.  Loosen the splint if your fingers tingle, get numb, or turn cold and blue.  Keep the splint clean.  If the splint is not waterproof: ? Do not let it get wet. ? Cover it with a watertight covering when you take a bath or a shower. Managing pain, stiffness, and swelling  If directed, put ice on the injured area: ? If you have a removable splint, take it off as told by your doctor. ? Put ice in a plastic bag. ? Place a towel between your skin and the bag. ? Leave the ice on for 20 minutes, 2-3 times a day.  Move your fingers often to help with stiffness and swelling.  Raise (elevate) the injured area above the level of your heart while you are sitting or lying down. Driving  Do not drive or use heavy machinery while taking prescription pain medicine.  Ask your doctor when it is safe to drive if you have a splint. General  instructions  Do not put pressure on any part of the splint until it is fully hardened. This may take several hours.  Do not use any products that contain nicotine or tobacco, such as cigarettes and e-cigarettes. These can delay bone healing. If you need help quitting, ask your doctor.  Take over-the-counter and prescription medicines only as told by your doctor.  Do exercises as told by your doctor.  Keep all follow-up visits as told by your doctor. This is important. Contact a doctor if:  Your pain or swelling gets worse.  You have trouble moving your finger. Get help right away if:  Your finger gets numb or turns blue. Summary  A finger fracture is a break in any of the bones in your fingers.  Usually, a broken finger is caused by an injury. This may be from getting hurt in sports or at work or from falling.  You may need to wear a splint on your finger so it will not move while it heals (immobilization). This information is not intended to replace advice given to you by your health care provider. Make sure you discuss any questions you have with your health care provider. Document Released: 02/03/2008 Document Revised: 07/07/2016 Document Reviewed: 07/07/2016 Elsevier Interactive Patient Education  2017 [employer].   IF you received an x-ray  today, you will receive an invoice from [facility]. Please contact [facility] at [phone] with questions or concerns regarding your invoice.   IF you received labwork today, you will receive an invoice from [facility]. Please contact [facility] at [phone] with questions or concerns regarding your invoice.   Our billing staff will not be able to assist you with questions regarding bills from these companies.  You will be contacted with the lab results as soon as they are available. The fastest way to get your results is to activate your My Chart account. Instructions are located on the last page of this  paperwork. If you have not heard from [place] regarding the results in 2 weeks, please contact this office.

## 2018-07-05 NOTE — Progress Notes (Signed)
Taylor Munoz  MRN: [id number] DOB: Nov 21, 1955  Subjective:  Taylor Munoz is a 62 y.o. female seen in office today for a chief complaint of fall down the stairs 5 days ago.  Tripped going down stairs. [place] Se[place] face first down entire flight of stairs.  Pain in left thumb, left wrist,  right shoulder, right ring finger, and right side of face. Feels like her upper teeth are in pain.   Denies headache, visual disturbance, nausea, vomiting, confusion, numbness, tingling.  Denies neck pain, back pain, and LOC. No blood thinners.  Has tried acetaminophen and ibuprofen with some relief. No PMH of osteoporosis.  Current every day smoker.   Review of Systems  Respiratory: Negative for cough and shortness of breath.   Cardiovascular: Negative for chest pain, palpitations and leg swelling.  Gastrointestinal: Negative for abdominal pain.  Neurological: Negative for dizziness, syncope, speech difficulty, weakness and light-headedness.    There are no active problems to display for this patient.   Current Outpatient Medications on File Prior to Visit  Medication Sig Dispense Refill  . acetaminophen (TYLENOL) 325 MG tablet Take 325 mg by mouth daily.    . calcium-vitamin D (OSCAL WITH D) 500-200 MG-UNIT per tablet Take 1 tablet by mouth daily.    Marland Kitchen ibuprofen (ADVIL,MOTRIN) 200 MG tablet Take 200 mg by mouth daily. Pt states she takes one ibuprofen and one tylenol every morning for knee pain, directed by RPh at [facility]    . Multiple Vitamin (MULTIVITAMIN WITH MINERALS) TABS Take 1 tablet by mouth daily.    Marland Kitchen OVER THE COUNTER MEDICATION Apply 1 application topically 2 (two) times daily. 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    . azithromycin (ZITHROMAX) 250 MG tablet Take 2 tabs PO x 1 dose, then 1 tab PO QD x 4 days (Patient not taking: Reported on 07/04/2018) 6 tablet 0  . glucosamine-chondroitin 500-400 MG tablet Take 1  tablet by mouth 3 (three) times daily.    Marland Kitchen omega-3 acid ethyl esters (LOVAZA) 1 G capsule Take 1 g by mouth 2 (two) times daily.     No current facility-administered medications on file prior to visit.     No Known Allergies    Social History   Socioeconomic History  . Marital status: Single    Spouse name: Not on file  . Number of children: 2  . Years of education: Not on file  . Highest education level: Not on file  Occupational History  . Not on file  Social Needs  . Financial resource strain: Not on file  . Food insecurity:    Worry: Not on file    Inability: Not on file  . Transportation needs:    Medical: Not on file    Non-medical: Not on file  Tobacco Use  . Smoking status: Current Some Day Smoker    Types: Cigarettes    Last attempt to quit: 10/25/2014    Years since quitting: 3.6  . Smokeless tobacco: Never Used  Substance and Sexual Activity  . Alcohol use: Yes    Alcohol/week: 1.0 standard drinks    Types: 1 Glasses of wine per week    Comment: occasional  . Drug use: No  . Sexual activity: Not on file  Lifestyle  . Physical activity:    Days per week: Not on file    Minutes per session: Not on file  .  Stress: Not on file  Relationships  . Social connections:    Talks on phone: Not on file    Gets together: Not on file    Attends religious service: Not on file    Active member of club or organization: Not on file    Attends meetings of clubs or organizations: Not on file    Relationship status: Not on file  . Intimate partner violence:    Fear of current or ex partner: Not on file    Emotionally abused: Not on file    Physically abused: Not on file    Forced sexual activity: Not on file  Other Topics Concern  . Not on file  Social History Narrative  . Not on file    Objective:  BP 102/69   Pulse 72   Temp 97.9 F (36.6 C) (Oral)   Resp 18   Ht 5' 4.96" (1.65 m)   Wt 115 lb 9.6 oz (52.4 kg)   SpO2 96%   BMI 19.26 kg/m   Physical  Exam  Constitutional: She is oriented to person, place, and time. She appears well-developed and well-nourished. No distress.  HENT:  Head: Normocephalic. Head is without raccoon's eyes, without Battle's sign, without right periorbital erythema and without left periorbital erythema.    Right Ear: Tympanic membrane, external ear and ear canal normal. No hemotympanum.  Left Ear: Tympanic membrane, external ear and ear canal normal.  Nose: No mucosal edema, rhinorrhea, sinus tenderness, nasal deformity, septal deviation or nasal septal hematoma. No epistaxis.  Mouth/Throat: Uvula is midline, oropharynx is clear and moist and mucous membranes are normal.  Eyes: Pupils are equal, round, and reactive to light. Conjunctivae and EOM are normal.  Neck: Normal range of motion.  Cardiovascular: Normal rate, regular rhythm and normal heart sounds.  Pulmonary/Chest: Effort normal and breath sounds normal. She has no wheezes. She has no rhonchi. She has no rales. She exhibits no tenderness, no bony tenderness, no crepitus and no swelling.  Abdominal: Soft. Normal appearance and bowel sounds are normal. There is no tenderness.  Musculoskeletal:       Right shoulder: She exhibits bony tenderness (+TTP with palpation of posteior aspect of left shoulder). She exhibits normal range of motion, no swelling, no crepitus, no spasm and normal strength.       Left shoulder: Normal.       Right wrist: Normal.       Left wrist: She exhibits decreased range of motion, bony tenderness (+TTP with paplpation of 1st metacarpal, anatomical snuffbox, and at distal aspect of radius) and swelling.       Right hip: Normal.       Left hip: Normal.       Right knee: She exhibits swelling and ecchymosis. Tenderness (+TTP with palpation of patella) found.       Left knee: Normal.       Right ankle: Normal.       Left ankle: Normal.       Cervical back: Normal.       Thoracic back: Normal.       Lumbar back: Normal.       Left  upper arm: Normal.       Left forearm: She exhibits bony tenderness and swelling.       Right hand: She exhibits decreased range of motion, bony tenderness (with palpation of 4th distal phalanx and DIP joint, overlying swelling,ecchymosis, and decreased ROM, sensation intact, skin warm to palpation) and swelling. She  exhibits normal capillary refill. Normal sensation noted.  Neurological: She is alert and oriented to person, place, and time. She has normal strength. No cranial nerve deficit or sensory deficit. She displays a negative Romberg sign. Gait normal.  Reflex Scores:      Tricep reflexes are 2+ on the right side and 2+ on the left side.      Bicep reflexes are 2+ on the right side and 2+ on the left side.      Brachioradialis reflexes are 2+ on the right side and 2+ on the left side.      Patellar reflexes are 2+ on the right side and 2+ on the left side.      Achilles reflexes are 2+ on the right side and 2+ on the left side. Skin: Skin is warm and dry. Bruising (noted on right anterior knee, right posterior shoulder, left lateral forearm and wrist, right distal ring finger, and left shin ) noted. No laceration noted.  Psychiatric: She has a normal mood and affect.  Vitals reviewed.  Dg Shoulder Right  Result Date: 07/05/2018 CLINICAL DATA:  Initial evaluation for acute trauma, fall. EXAM: RIGHT SHOULDER - 2+ VIEW COMPARISON:  None. FINDINGS: No acute fracture dislocation. Humeral head in normal alignment within the glenoid. AC joint approximated. No periarticular calcification. No acute soft tissue abnormality. Moderate osteoarthritic changes noted at the [facility] joint. Visualized right hemithorax grossly clear. IMPRESSION: 1. No acute osseous abnormality about the right shoulder. 2. Moderate degenerative osteoarthritic changes at the right acromioclavicular joint. Electronically Signed   By: Rise Mu M.D.   On: 07/05/2018 15:09   Dg Forearm Left  Result Date:  07/05/2018 CLINICAL DATA:  Initial evaluation for acute trauma, fall. EXAM: LEFT FOREARM - 2 VIEW COMPARISON:  None. FINDINGS: There is no evidence of fracture or other focal bone lesions. Soft tissues are unremarkable. IMPRESSION: Negative. Electronically Signed   By: Rise Mu M.D.   On: 07/05/2018 15:01   Dg Wrist Complete Left  Result Date: 07/05/2018 CLINICAL DATA:  Initial evaluation for acute trauma, fall. EXAM: LEFT WRIST - COMPLETE 3+ VIEW COMPARISON:  None. FINDINGS: There is no evidence of fracture or dislocation. There is no evidence of arthropathy or other focal bone abnormality. Soft tissues are unremarkable. IMPRESSION: Negative. Electronically Signed   By: Rise Mu M.D.   On: 07/05/2018 15:00   Dg Hand Complete Left  Result Date: 07/05/2018 CLINICAL DATA:  Initial evaluation for acute trauma, fall. EXAM: LEFT HAND - COMPLETE 3+ VIEW COMPARISON:  None available. FINDINGS: No acute fracture or dislocation. Osseous mineralization normal. No soft tissue abnormality. Scattered osteoarthritic changes present throughout the left hand, most notable at the left second and third DIP joints. IMPRESSION: 1. No acute osseous abnormality about the left hand. 2. Degenerative osteoarthritic changes throughout the hand, most notable at the left second and third DIP joints. Electronically Signed   By: Rise Mu M.D.   On: 07/05/2018 15:06   Dg Finger Ring Right  Result Date: 07/05/2018 CLINICAL DATA:  Initial evaluation for acute trauma, fall. EXAM: RIGHT RING FINGER 2+V COMPARISON:  None. FINDINGS: There is an acute oblique fracture extending through the base of the right fourth distal phalanx with dorsal and proximal displacement, best seen on lateral view. Associated intra-articular extension into the right fourth DIP joint. Mild overlying soft tissue swelling. No other acute fracture or dislocation. Moderate osteoarthritic changes noted throughout the visualized right  hand. IMPRESSION: Acute oblique fracture extending through the base  of the right fourth distal phalanx with intra-articular extension and dorsal displacement. Electronically Signed   By: Rise Mu M.D.   On: 07/05/2018 15:04    Assessment and Plan :  1. Closed displaced fracture of distal phalanx of right ring finger, initial encounter She is neurovascularly intact.  Finger splint applied.  Due to displacement and extension into the intra-articular space, will refer to orthopedics for further evaluation and management. - Ambulatory referral to Orthopedic Surgery - Apply finger splint static   2. Post-traumatic headache, not intractable, unspecified chronicity pattern Stat CT pending.  Will contact patient with results and discuss further treatment plan. - CT Maxillofacial WO CM; Future - CT Head Wo Contrast; Future  3. Acute facial pain See above. - CT Maxillofacial WO CM; Future - CT Head Wo Contrast; Future  4. Left wrist pain Plain films of wrist, forearm, and hand negative.  Suspect wrist sprain.  Wrist brace provided.  Recommended ice, elevation, and over-the-counter anti-inflammatories as prescribed as needed. - DG Wrist Complete Left; Future - DG Forearm Left; Future - DG Hand Complete Left; Future - Wrist splint  5. Acute pain of right knee Plain films negative for acute injury. - DG Knee Complete 4 Views Right; Future  6. Pain in joint of right shoulder Plain films negative for acute injury. - DG Shoulder Right; Future  7. Pain of finger of right hand - DG Finger Ring Right; Future  8. Fall, initial encounter Patient has suffered a severe fall.  Only fracture noted at this point is finger fracture.  Sending for stat CT of the head and maxillofacial region.  No loss of consciousness, neck pain, back pain, or hip pain, which is reassuring.  No symptoms/signs of concussion.  Neuro exam normal.  Will contact patient once we have results and discuss further  treatment plan.  Discussed safety and fall risk at home.   Benjiman Core PA-C  Primary Care at [facility] 07/05/2018 3:14 PM

## 2018-07-07 ENCOUNTER — Ambulatory Visit (INDEPENDENT_AMBULATORY_CARE_PROVIDER_SITE_OTHER): Payer: BLUE CROSS/BLUE SHIELD | Admitting: Orthopaedic Surgery

## 2018-07-07 DIAGNOSIS — M20011 Mallet finger of right finger(s): Secondary | ICD-10-CM | POA: Diagnosis not present

## 2018-07-07 NOTE — Progress Notes (Signed)
Office Visit Note   Patient: Taylor Munoz           Date of Birth: May 31, 1956           MRN: [id number] Visit Date: 07/07/2018              Requested by: Freddy Finner, MD [address] [place], [place] [address] PCP: Freddy Finner, MD   Assessment & Plan: Visit Diagnoses:  1. Mallet finger of right finger(s)     Plan: Impression is bony mallet right ring finger.  We will place the patient in a stacked splint in extension leaving the PIP joint free.  She will wear this until she is 4 weeks out from injury.  She will follow-up with [place] in 3 to 4 weeks time for recheck and likely refer to hand therapy..  Call with concerns or questions in the meantime.  Follow-Up Instructions: Return in about 3 weeks (around 07/28/2018).   Orders:  No orders of the defined types were placed in this encounter.  No orders of the defined types were placed in this encounter.     Procedures: No procedures performed   Clinical Data: No additional findings.   Subjective: Chief Complaint  Patient presents with  . Right Hand - Fracture    HPI patient is a pleasant 62 year old female who presents to our clinic today for follow-up of an injury to her right ring finger.  On 07/01/2018, she sustained a fall going down a set of stairs.  She is unsure how she landed but did injure her right ring finger.  She was seen at [facility] where x-rays were obtained.  These showed a bony mallet finger to the right ring finger.  She was placed in a soft dressing.  She is here today for follow-up.  Continued pain, swelling and ecchymosis to the right ring finger.  Pain is worse with motion.  She has been taking over-the-counter medications.  Review of Systems as detailed in HPI.  All others reviewed and are negative.   Objective: Vital Signs: There were no vitals taken for this visit.  Physical Exam well-developed well-nourished female no acute distress.  Alert and oriented  Ortho Exam    Examination of her right ring finger reveals slight flexion to the DIP joint.  Tenderness and swelling throughout with associated ecchymosis.  Painful range of motion of the DIP joint.  She is neurovascularly intact distally.  Specialty Comments:  No specialty comments available.  Imaging: No new imaging today   PMFS History: Patient Active Problem List   Diagnosis Date Noted  . Mallet finger of right finger(s) 07/07/2018   Past Medical History:  Diagnosis Date  . Arthritis   . Bursitis   . Head injury     No family history on file.  Past Surgical History:  Procedure Laterality Date  . APPENDECTOMY    . BRAIN SURGERY    . CESAREAN SECTION    . FRACTURE SURGERY    . tracheotomy     Social History   Occupational History  . Not on file  Tobacco Use  . Smoking status: Current Some Day Smoker    Types: Cigarettes    Last attempt to quit: 10/25/2014    Years since quitting: 3.7  . Smokeless tobacco: Never Used  Substance and Sexual Activity  . Alcohol use: Yes    Alcohol/week: 1.0 standard drinks    Types: 1 Glasses of wine per week  Comment: occasional  . Drug use: No  . Sexual activity: Not on file

## 2018-08-02 ENCOUNTER — Ambulatory Visit (INDEPENDENT_AMBULATORY_CARE_PROVIDER_SITE_OTHER): Payer: Self-pay

## 2018-08-02 ENCOUNTER — Ambulatory Visit (INDEPENDENT_AMBULATORY_CARE_PROVIDER_SITE_OTHER): Payer: BLUE CROSS/BLUE SHIELD | Admitting: Orthopaedic Surgery

## 2018-08-02 ENCOUNTER — Encounter (INDEPENDENT_AMBULATORY_CARE_PROVIDER_SITE_OTHER): Payer: Self-pay | Admitting: Orthopaedic Surgery

## 2018-08-02 DIAGNOSIS — M20011 Mallet finger of right finger(s): Secondary | ICD-10-CM | POA: Diagnosis not present

## 2018-08-02 DIAGNOSIS — M1711 Unilateral primary osteoarthritis, right knee: Secondary | ICD-10-CM

## 2018-08-02 MED ORDER — MELOXICAM 7.5 MG PO TABS: 7.5000 mg | ORAL_TABLET | Freq: Two times a day (BID) | ORAL | 2 refills | Status: AC | PRN

## 2018-08-02 MED ORDER — DICLOFENAC SODIUM 1 % TD GEL: 2.0000 g | Freq: Four times a day (QID) | TRANSDERMAL | 5 refills | Status: AC

## 2018-08-02 NOTE — Progress Notes (Signed)
Office Visit Note   Patient: Taylor Munoz           Date of Birth: 01-Dec-1955           MRN: [id number] Visit Date: 08/02/2018              Requested by: Taylor Munoz, Taylor Ronald, MD [address] [place], [place] [id number] PCP: Taylor Munoz, Taylor Ronald, MD   Assessment & Plan: Visit Diagnoses:  1. Mallet finger of right finger(s)   2. Unilateral primary osteoarthritis, right knee     Plan: Impression is healing right bony mallet ring finger and right knee osteoarthritis.  In terms of her bony mallet finger I would like her to start hand therapy in 2 weeks.  She does not have good insurance coverage at this time and so she is worried about cost which certainly I am sympathetic towards.  I recommend attending 1 session and then doing home exercises from there on out.  I would like to recheck her in 4 weeks with 2 view x-rays of the right ring finger.  In terms of her knees I did review the x-rays from earlier this month and I recommend trying Voltaren gel meloxicam to see if this will give her some relief.  I did offer a cortisone injection but she declined.  Again cost is a concern at this time when it comes to medical treatments.  Follow-Up Instructions: Return in about 4 weeks (around 08/30/2018).   Orders:  Orders Placed This Encounter  Procedures  . XR Finger Ring Right   Meds ordered this encounter  Medications  . diclofenac sodium (VOLTAREN) 1 % GEL    Sig: Apply 2 g topically 4 (four) times daily.    Dispense:  1 Tube    Refill:  5  . meloxicam (MOBIC) 7.5 MG tablet    Sig: Take 1 tablet (7.5 mg total) by mouth 2 (two) times daily as needed for pain.    Dispense:  30 tablet    Refill:  2      Procedures: No procedures performed   Clinical Data: No additional findings.   Subjective: Chief Complaint  Patient presents with  . Right Ring Finger - Follow-up  . Left Hand - Follow-up    Taylor BradfordKimberly follows up today for her bony mallet finger injury to her right ring  finger.  She is also complaining of right knee pain that is chronic.  She has seen other orthopedists here in town for this.  In terms of her fingers she is wearing an AlumaFoam splint which she is tolerating well.  She has minimal pain.  She is doing well overall.  In terms of the knee she states that she has a chronic aching pain without any swelling.  It does cause her need to give out and it recently caused her to fall down the stairs which led to the bony mallet finger.   Review of Systems  Constitutional: Negative.   HENT: Negative.   Eyes: Negative.   Respiratory: Negative.   Cardiovascular: Negative.   Endocrine: Negative.   Musculoskeletal: Negative.   Neurological: Negative.   Hematological: Negative.   Psychiatric/Behavioral: Negative.   All other systems reviewed and are negative.    Objective: Vital Signs: There were no vitals taken for this visit.  Physical Exam  Constitutional: She is oriented to person, place, and time. She appears well-developed and well-nourished.  HENT:  Head: Normocephalic and atraumatic.  Eyes: EOM are normal.  Neck: Neck supple.  Pulmonary/Chest: Effort normal.  Abdominal: Soft.  Neurological: She is alert and oriented to person, place, and time.  Skin: Skin is warm. Capillary refill takes less than 2 seconds.  Psychiatric: She has a normal mood and affect. Her behavior is normal. Judgment and thought content normal.  Nursing note and vitals reviewed.   Ortho Exam Right knee exam is essentially unremarkable.  Minimal patellofemoral crepitus.  Normal range of motion. Right ring finger exam shows well fitting AlumaFoam splint.  Minimal tenderness to palpation of the DIP joint.  Finger is well aligned. Specialty Comments:  No specialty comments available.  Imaging: Xr Finger Ring Right  Result Date: 08/02/2018 Stable appearance of bony mallet finger fracture.  No complications.  No dislocation of the joint.    PMFS History: Patient  Active Problem List   Diagnosis Date Noted  . Mallet finger of right finger(s) 07/07/2018   Past Medical History:  Diagnosis Date  . Arthritis   . Bursitis   . Head injury     History reviewed. No pertinent family history.  Past Surgical History:  Procedure Laterality Date  . APPENDECTOMY    . BRAIN SURGERY    . CESAREAN SECTION    . FRACTURE SURGERY    . tracheotomy     Social History   Occupational History  . Not on file  Tobacco Use  . Smoking status: Current Some Day Smoker    Types: Cigarettes    Last attempt to quit: 10/25/2014    Years since quitting: 3.7  . Smokeless tobacco: Never Used  Substance and Sexual Activity  . Alcohol use: Yes    Alcohol/week: 1.0 standard drinks    Types: 1 Glasses of wine per week    Comment: occasional  . Drug use: No  . Sexual activity: Not on file

## 2018-08-04 ENCOUNTER — Ambulatory Visit (INDEPENDENT_AMBULATORY_CARE_PROVIDER_SITE_OTHER): Payer: BLUE CROSS/BLUE SHIELD | Admitting: Orthopaedic Surgery

## 2018-08-05 ENCOUNTER — Ambulatory Visit (INDEPENDENT_AMBULATORY_CARE_PROVIDER_SITE_OTHER): Payer: BLUE CROSS/BLUE SHIELD | Admitting: Orthopaedic Surgery

## 2018-08-30 ENCOUNTER — Ambulatory Visit (INDEPENDENT_AMBULATORY_CARE_PROVIDER_SITE_OTHER): Payer: BLUE CROSS/BLUE SHIELD | Admitting: Orthopaedic Surgery

## 2019-02-05 IMAGING — DX DG WRIST COMPLETE 3+V*L*
4 series · 4 of 4 positions shown · non-contrast
Comparison: None.

CLINICAL DATA: Initial evaluation for acute trauma, fall.

EXAM:
LEFT WRIST - COMPLETE 3+ VIEW

[wrist pa]
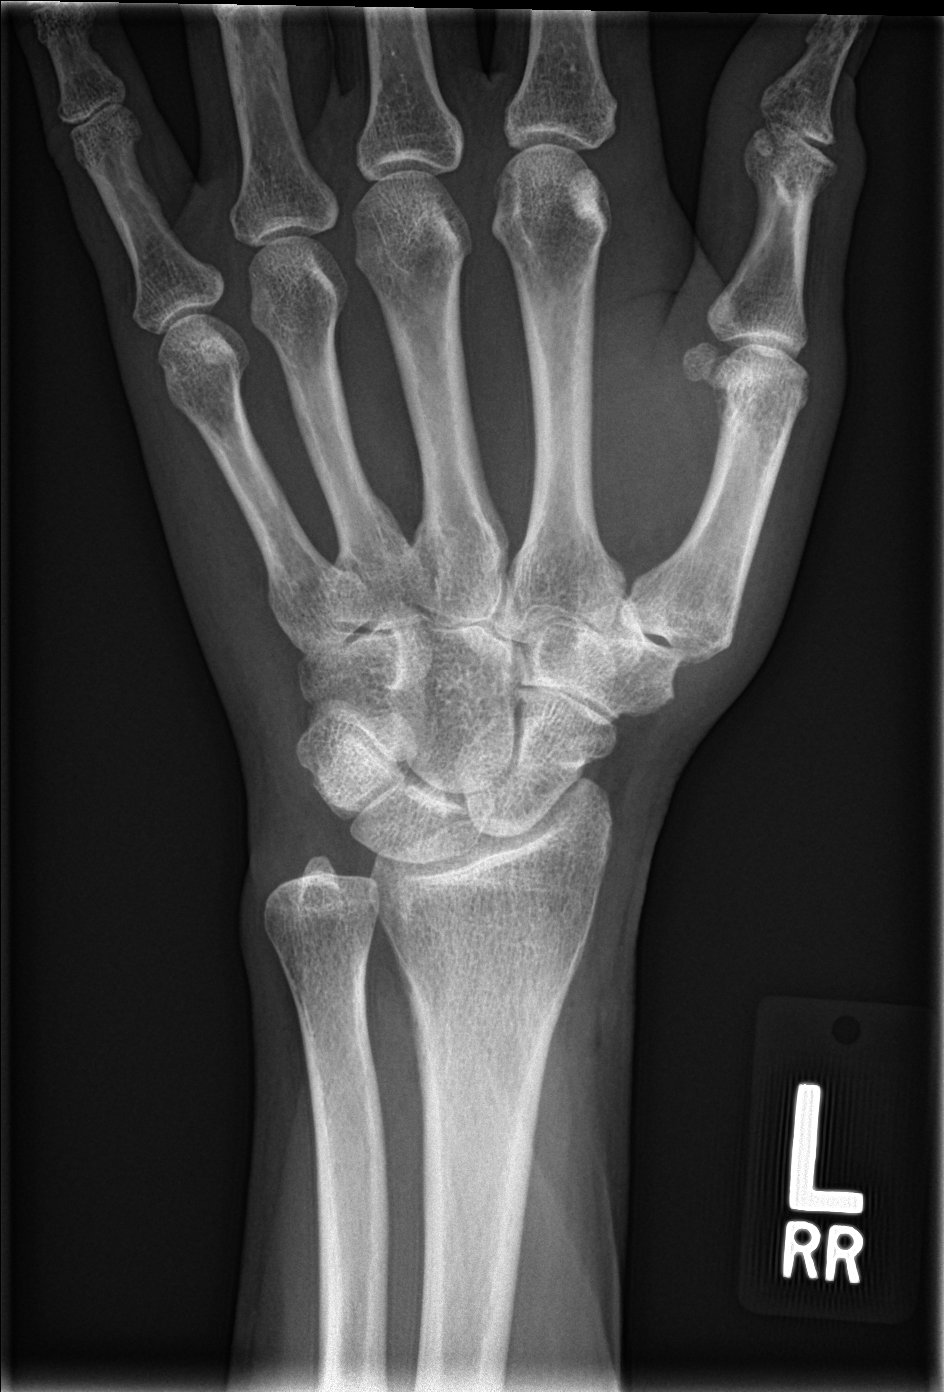

[wrist obl]
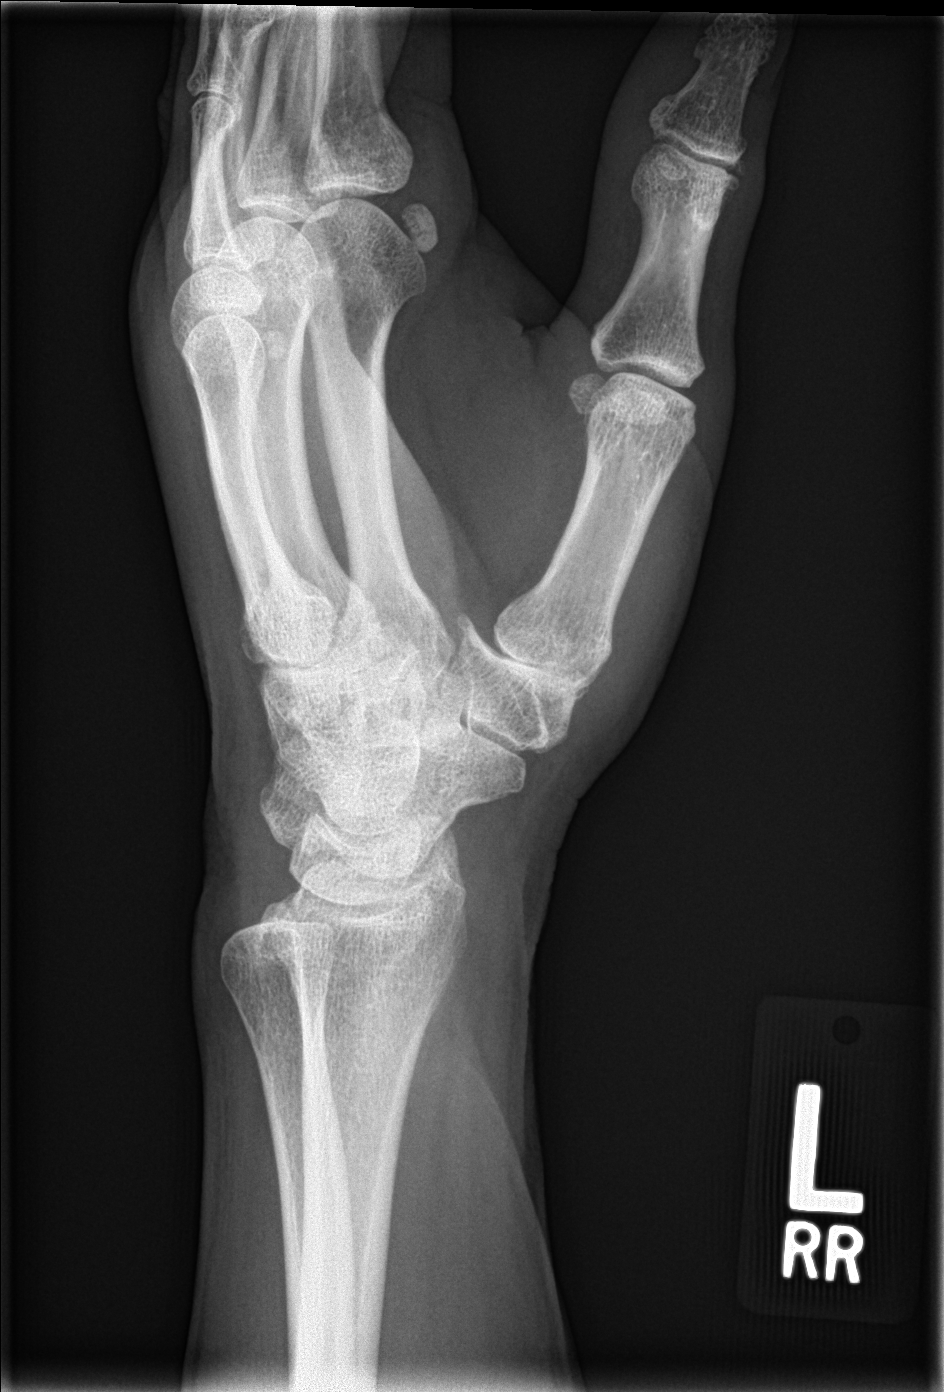

[wrist lat]
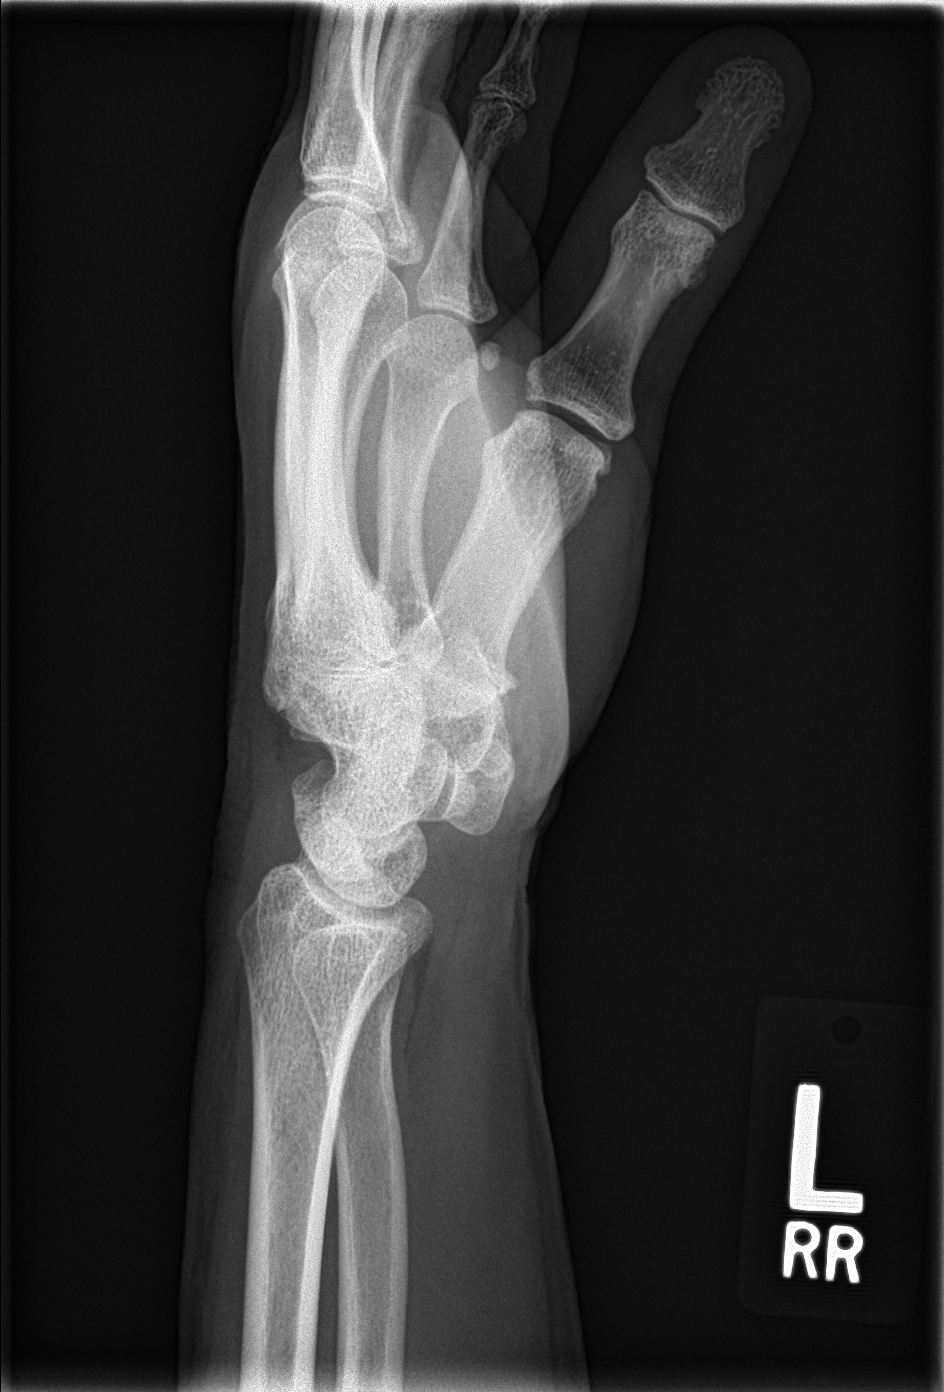

[wrist navicular]
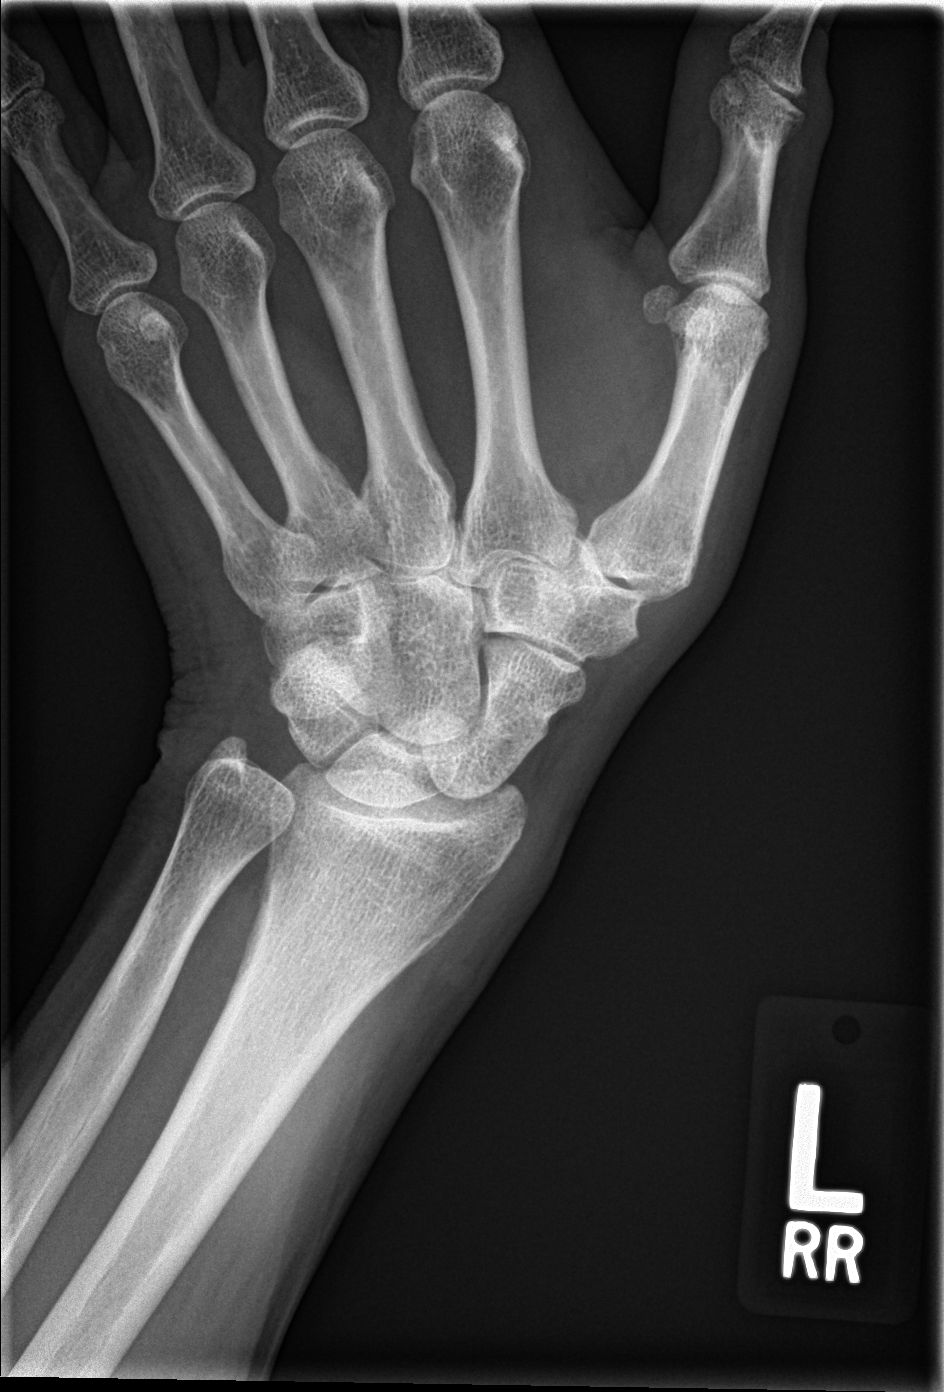

[4 of 4 positions shown; findings below may reference images not displayed]

FINDINGS: There is no evidence of fracture or dislocation. There is no
evidence of arthropathy or other focal bone abnormality. Soft
tissues are unremarkable.
IMPRESSION: Negative.

## 2019-05-11 ENCOUNTER — Telehealth: Payer: Self-pay | Admitting: Internal Medicine

## 2019-05-11 NOTE — Telephone Encounter (Signed)
Attempted to call patient to schedule a consult, patient did not answer, left message to call back.  Routing message to Kathlee Nations about billing questions.    Message from Dr. Chase Caller  "Please ask him a) if his friend Rosealyn Little is sstill uninsured? B) yes can see her for 30 min visit C) will probably have to charge something  - please find out from [facility] what level 2,  3, and level 4 and level 5 new office visit is for direct pay ? We might end up doing a level 2 or 3 visit - pls let me know this once you get answrrs and we can give him the answers  Thanks    SIGNATURE    Dr. Brand Males, M.D., F.C.C.P,  Pulmonary and Critical Care Medicine Staff Physician, [place] Director - Interstitial Lung Disease  Program  [facility] [place] [facility] [place], [place], [address]  Pager: [phone], If no answer or between  15:00h - 7:00h: call [phone] Telephone: [phone]  12:33 PM 05/11/2019

## 2019-05-12 NOTE — Telephone Encounter (Signed)
Call made to patient, appt made for 09/29. Aware we are awaiting f/u from billing. I explained that it can be hard to pre-estimate how much a visit will be but we would be willing to help in whatever capacity we could. Voiced understanding.   Kathlee Nations aware (message has been routed).

## 2019-05-24 NOTE — Telephone Encounter (Signed)
Attempted to call patient to review the range of cost since she is not insured, no answer, left message to call back.  Routing to Dr. Chase Caller per his request to review cost to patient for levels of service.

## 2019-05-24 NOTE — Telephone Encounter (Signed)
Please let Taylor Munoz that the visit could range from $140 - $215 without tests. This is just for history and physical

## 2019-05-24 NOTE — Telephone Encounter (Signed)
Called spoke with patient. She states she has already called and cancelled her appointment, the cost is too much for her at this time.  Nothing further needed at this time.

## 2019-05-24 NOTE — Telephone Encounter (Signed)
A level 3 new patient charge is $260.  If she has no insurance she gets a 56% discount which makes it $140.40.  Level 4 new patient charge is $395, with the discount is $213.30

## 2019-05-30 ENCOUNTER — Institutional Professional Consult (permissible substitution): Payer: BLUE CROSS/BLUE SHIELD | Admitting: Internal Medicine
